# Patient Record
Sex: Male | Born: 1975 | Race: White | Hispanic: No | Marital: Single
Health system: Midwestern US, Community
[De-identification: ages and names within clinical notes are randomized; demographics above are authoritative.]

## PROBLEM LIST (undated history)

## (undated) DIAGNOSIS — K219 Gastro-esophageal reflux disease without esophagitis: Secondary | ICD-10-CM

## (undated) HISTORY — PX: KNEE SURGERY: SHX244

## (undated) HISTORY — PX: SHOULDER SURGERY: SHX246

---

## 2017-04-01 ENCOUNTER — Other Ambulatory Visit: Payer: Self-pay

## 2017-04-01 ENCOUNTER — Encounter (HOSPITAL_BASED_OUTPATIENT_CLINIC_OR_DEPARTMENT_OTHER): Payer: Self-pay

## 2017-04-01 ENCOUNTER — Emergency Department (HOSPITAL_BASED_OUTPATIENT_CLINIC_OR_DEPARTMENT_OTHER)
Admission: EM | Admit: 2017-04-01 | Discharge: 2017-04-01 | Disposition: A | Discharge status: Home / Self Care | Payer: Worker's Compensation | Attending: Emergency Medicine | Admitting: Emergency Medicine

## 2017-04-01 ENCOUNTER — Emergency Department (HOSPITAL_BASED_OUTPATIENT_CLINIC_OR_DEPARTMENT_OTHER): Payer: Worker's Compensation

## 2017-04-01 DIAGNOSIS — S92511A Displaced fracture of proximal phalanx of right lesser toe(s), initial encounter for closed fracture: Secondary | ICD-10-CM | POA: Insufficient documentation

## 2017-04-01 DIAGNOSIS — W230XXA Caught, crushed, jammed, or pinched between moving objects, initial encounter: Secondary | ICD-10-CM | POA: Diagnosis not present

## 2017-04-01 DIAGNOSIS — Y990 Civilian activity done for income or pay: Secondary | ICD-10-CM | POA: Diagnosis not present

## 2017-04-01 DIAGNOSIS — S92536A Nondisplaced fracture of distal phalanx of unspecified lesser toe(s), initial encounter for closed fracture: Secondary | ICD-10-CM

## 2017-04-01 DIAGNOSIS — Y939 Activity, unspecified: Secondary | ICD-10-CM | POA: Diagnosis not present

## 2017-04-01 DIAGNOSIS — Y929 Unspecified place or not applicable: Secondary | ICD-10-CM | POA: Insufficient documentation

## 2017-04-01 DIAGNOSIS — S99921A Unspecified injury of right foot, initial encounter: Secondary | ICD-10-CM | POA: Diagnosis present

## 2017-04-01 MED ORDER — TRAMADOL HCL 50 MG PO TABS: 50.0000 mg | ORAL_TABLET | Freq: Four times a day (QID) | ORAL | 0 refills | Status: AC | PRN

## 2017-04-01 NOTE — Discharge Instructions (Signed)
Your seen here today and found to have distal phalanx fracture of the second and third digits.  I have placed you in buddy tape and postop shoe for protection.  Please wear these when walking until follow-up with your the orthopedist I have referred you to.   For pain control you may take: 800mg  of ibuprofen (that is usually four 200mg  over the counter pills) up to 3 times a day (please take with food) and acetaminophen 975mg  (this is 3 normal strength, 325mg , over the counter pills) up to four times a day. Please do not take more than this. Do not drink alcohol or combine with other medications that have acetaminophen or Ibuprofen as an ingredient (Read the labels!).    For breakthrough pain you may take Ultram. Do not drink alcohol drive or operate heavy machinery when taking. You are being provided a prescription for opiates (also known as narcotics) for pain control on an ?as needed? basis.  Opiates can be addictive and should only be used when absolutely necessary for pain control when other alternatives do not work.  We recommend you only use them for the recommended amount of time and only as prescribed.  Please do not take with other sedative medications or alcohol.  Please do not drive, operate machinery, or make important decisions while taking opiates.  Please note that these medications can be addictive and have high abuse potential.  Please keep these medications locked away from children, teenagers or any family members with history of substance abuse. Additionally, these medications may cause constipation - take over the counter stool softeners or add fiber to your diet to treat this (Metamucil, Psyllium Fiber, Colace, Miralax) Further refills will need to be obtained from your primary care doctor and will not be prescribed through the Emergency Department. You will test positive on most drug tests while taking this medication.

## 2017-04-01 NOTE — ED Notes (Signed)
Pt. Has noted edema in the Second L toe and Third L toe after a crush type injury today at his job.  Pt reports a heavy object ran over his toes.  Pt. Toenail on the Second L toes is still slightly attached and discolored.  The Third L toenail is still attached and WNL upon assessment.  The Second L toes is purple and edematous at the End of the toe.  Pt. Third L toe is edematous and slightly discolored.  Both are slightly bleeding and controlled.

## 2017-04-01 NOTE — ED Triage Notes (Signed)
Pt reports that he crushed his toes with a "maintence stand" that is "a couple hundred pounds." Pt has bandage on 2nd and third digit.

## 2017-04-01 NOTE — ED Provider Notes (Signed)
[facility] EMERGENCY DEPARTMENT Provider Note   CSN: [id number] Arrival date & time: 04/01/17  1725     History   Chief Complaint Chief Complaint  Patient presents with  . Foot Injury    HPI Matthew Fritz is a 42 y.o. male with no signficant past medical history who presents to the ED today for crush like injury to right foot. Patinet states around 420 today he was at work and a large middle structure that weighs greater than 100 pounds rolled over his right foot.  He notes that he now has pain to the second and third digits as worse with ambulation and bending of the toes.  He has taken Tylenol prior to arrival with great relief of his symptoms.  He denies any associated numbness or tingling. Tetanus is up to date.   HPI  History reviewed. No pertinent past medical history.  There are no active problems to display for this patient.   Past Surgical History:  Procedure Laterality Date  . KNEE SURGERY Right   . SHOULDER SURGERY Right        Home Medications    Prior to Admission medications   Not on File    Family History No family history on file.  Social History Social History   Tobacco Use  . Smoking status: Never Smoker  . Smokeless tobacco: Never Used  Substance Use Topics  . Alcohol use: Yes  . Drug use: No     Allergies   Penicillins   Review of Systems Review of Systems  All other systems reviewed and are negative.    Physical Exam Updated Vital Signs BP 129/69 (BP Location: Left Arm)   Pulse 77   Temp 98.1 F (36.7 C) (Oral)   Resp 18   Ht 6' (1.829 m)   Wt 111.1 kg (245 lb)   SpO2 100%   BMI 33.23 kg/m   Physical Exam  Constitutional: He appears well-developed and well-nourished.  HENT:  Head: Normocephalic and atraumatic.  Right Ear: External ear normal.  Left Ear: External ear normal.  Eyes: Conjunctivae are normal. Right eye exhibits no discharge. Left eye exhibits no discharge. No scleral icterus.    Pulmonary/Chest: Effort normal. No respiratory distress.  Musculoskeletal:       Right ankle: Normal.       Right foot: There is tenderness.       Feet:  Patient with TTP of the distal 2nd and 3rd digit of the right foot. No obvious laceration to the 2nd toenail. There is noting to be some lifting of the 2nd digit toenail without obvious subungual hematoma. Distal 2nd toe is ecchymotic and edematous. Toepad soft. Compartments soft. NVI.   Neurological: He is alert. He has normal strength. No sensory deficit.  Skin: Skin is warm and dry. Capillary refill takes less than 2 seconds. No laceration noted. No pallor.  Psychiatric: He has a normal mood and affect.  Nursing note and vitals reviewed.    ED Treatments / Results  Labs (all labs ordered are listed, but only abnormal results are displayed) Labs Reviewed - No data to display  EKG  EKG Interpretation None       Radiology Dg Foot Complete Right  Result Date: 04/01/2017 CLINICAL DATA:  Crush injury to the second and third toes. EXAM: RIGHT FOOT COMPLETE - 3+ VIEW COMPARISON:  None. FINDINGS: There are comminuted, essentially nondisplaced fractures of the second and third distal phalanges. Alignment is normal. Mild hallux valgus deformity with  associated first MTP joint osteoarthritis. Juxta-articular erosion along the lateral aspect of the third metatarsal head. Bone mineralization is normal. Soft tissues are unremarkable. IMPRESSION: 1. Comminuted, essentially nondisplaced fractures of the second and third distal phalanges. 2. Chronic appearing juxta-articular erosion at the lateral aspect of the third metatarsal head, nonspecific, but can be seen with gout. Electronically Signed   By: Obie DredgeWilliam T Derry M.D.   On: 04/01/2017 18:25    Procedures Procedures (including critical care time) SPLINT APPLICATION Date/Time: 7:00 PM Authorized by: Jacinto HalimMichael M Neil Brickell Consent: Verbal consent obtained. Risks and benefits: risks, benefits and  alternatives were discussed Consent given by: patient Splint applied by: orthopedic technician Location details: right foot  Splint type: Post-op shoe Supplies used: post op shoe Post-procedure: The splinted body part was neurovascularly unchanged following the procedure. Patient tolerance: Patient tolerated the procedure well with no immediate complications.   Medications Ordered in ED Medications - No data to display   Initial Impression / Assessment and Plan / ED Course  I have reviewed the triage vital signs and the nursing notes.  Pertinent labs & imaging results that were available during my care of the patient were reviewed by me and considered in my medical decision making (see chart for details).     42 y.o. male with a crush-like injury to right foot.  There is noted to be comminuted, nondisplaced fractures of the second and third distal phalanx.  Patient without obvious open wound. Appears to be a closed fracture. There is some no changes to the second toenail but without obvious laceration that required repair in the department.  His tetanus is up-to-date.  Will place him in a postop shoe with buddy tape.  Pain is currently controlled with over-the-counter medications.  Will give referral for orthopedist to follow-up this week.  Return precautions discussed.  Patient appears safe for discharge.  Patient case discussed with Dr. Anitra LauthPlunkett who is in agreement with plan.   Final Clinical Impressions(s) / ED Diagnoses   Final diagnoses:  Nondisplaced fracture of distal phalanx of toe    ED Discharge Orders    None       Jacinto HalimMaczis, Alee Gressman M, Cordelia Poche-C 04/02/17 Valentina [id number]    Gwyneth SproutPlunkett, Whitney, MD 04/03/17 [phone]

## 2018-09-22 IMAGING — CR DG FOOT COMPLETE 3+V*R*
3 series · 3 of 3 positions shown · non-contrast
Comparison: None.

CLINICAL DATA: Crush injury to the second and third toes.

EXAM:
RIGHT FOOT COMPLETE - 3+ VIEW

[t foot ap right]
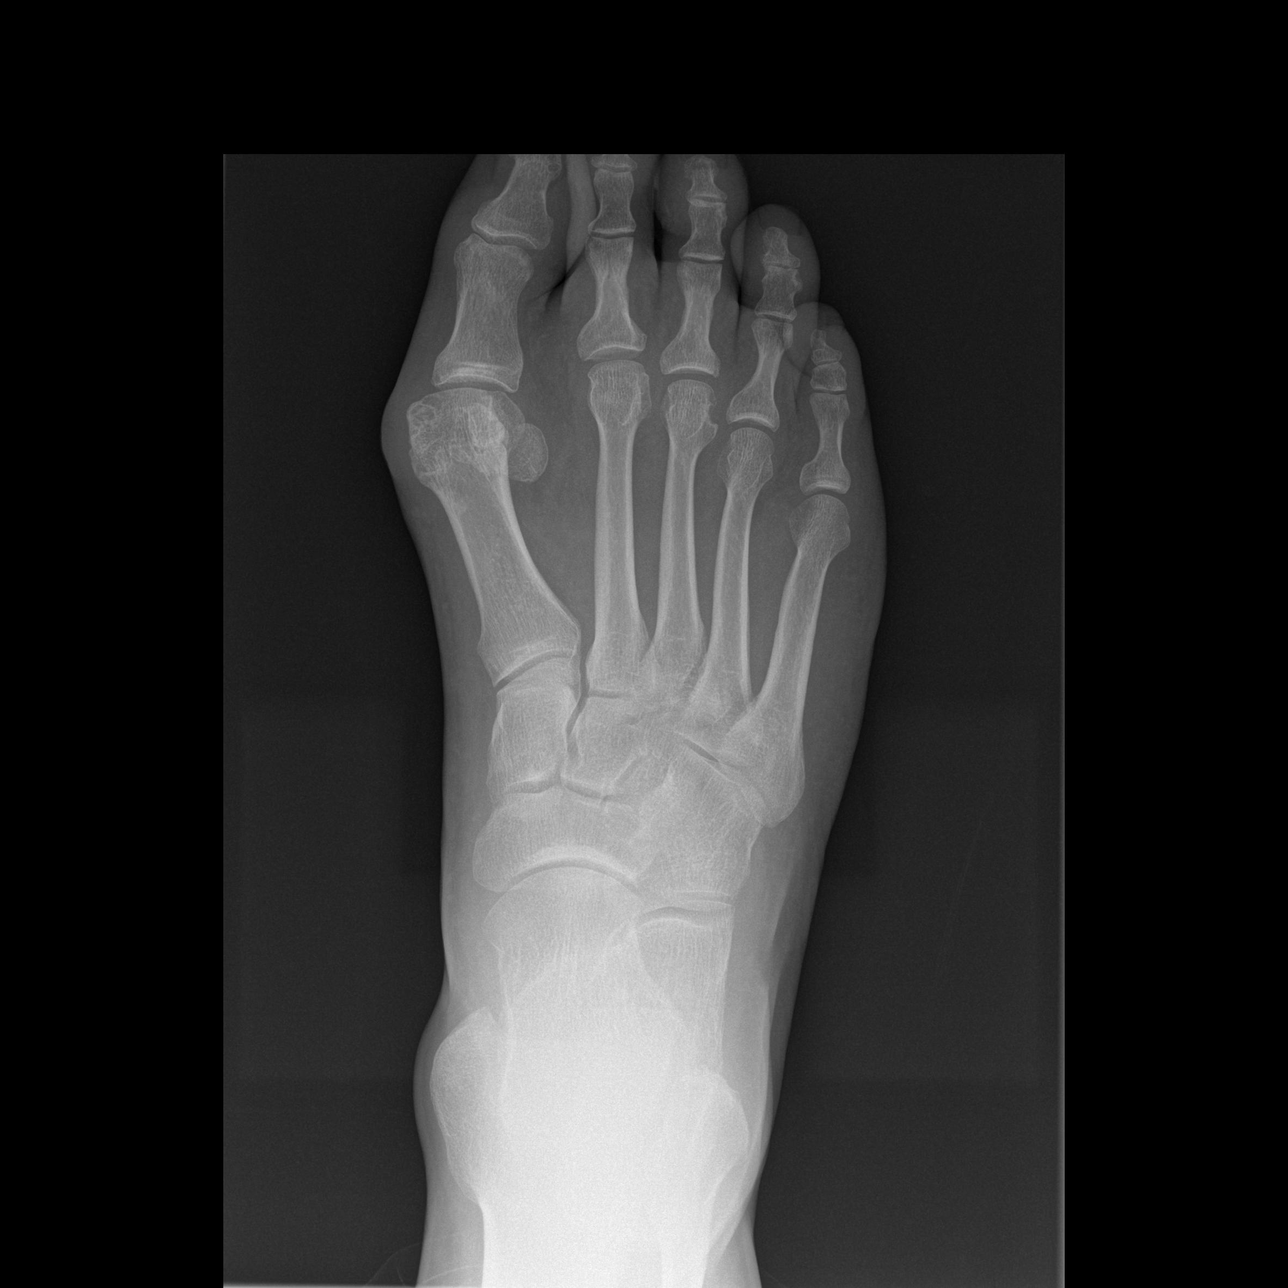

[t foot oblique right]
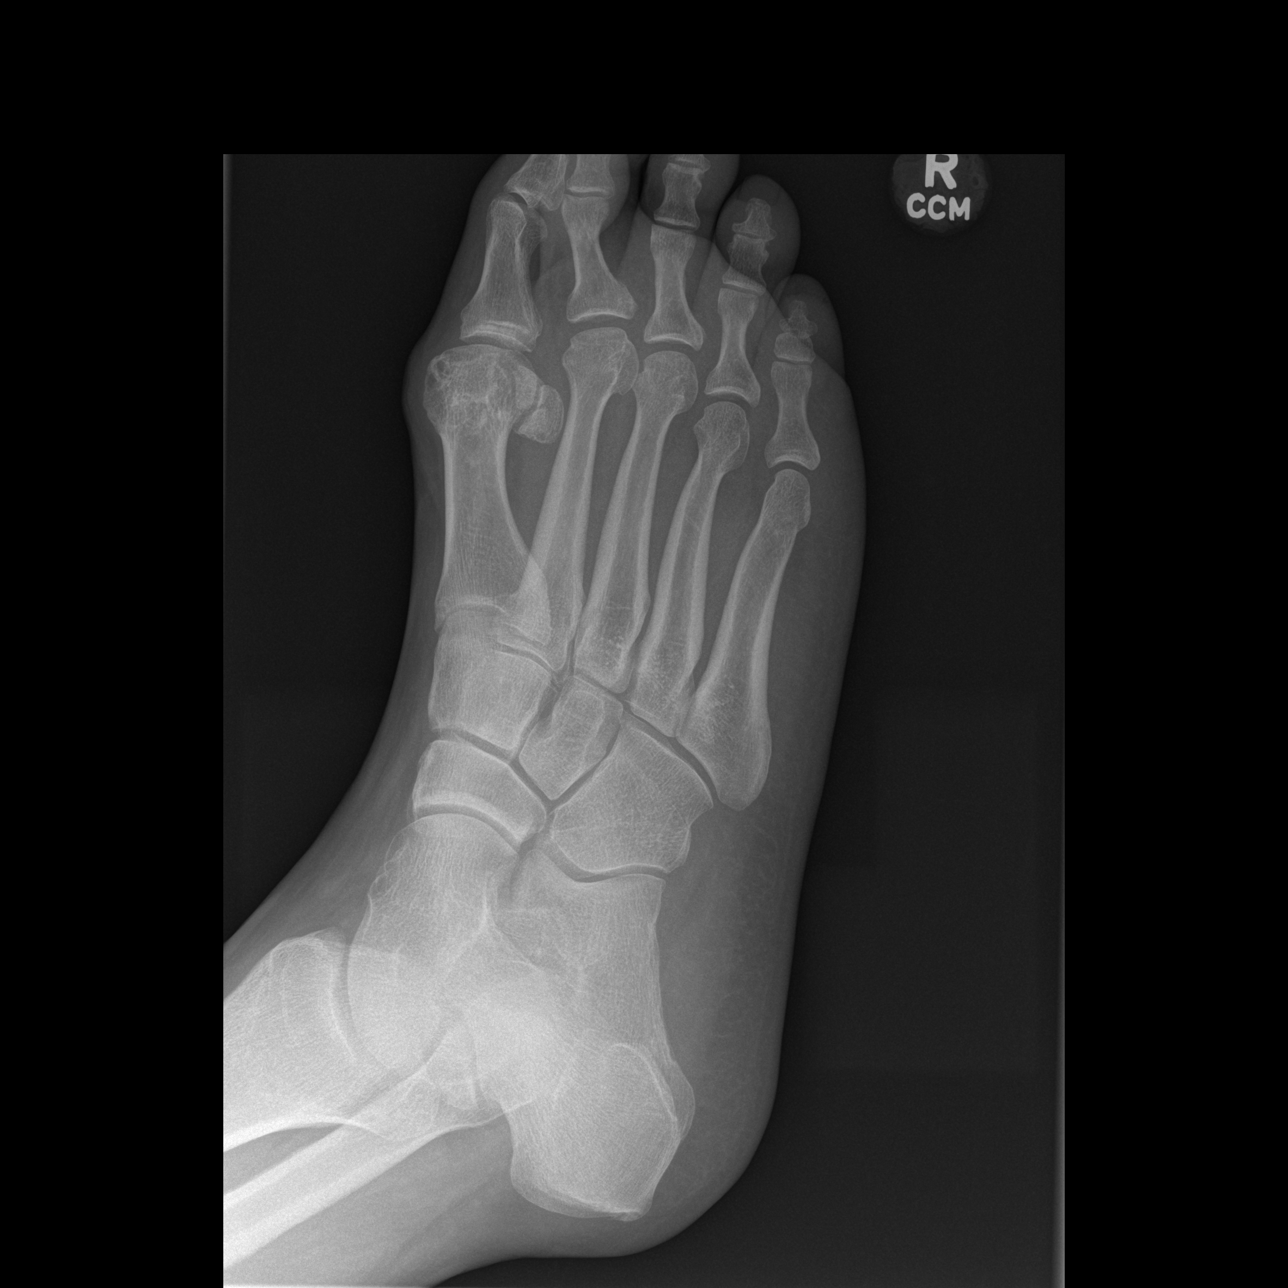

[t foot lat right]
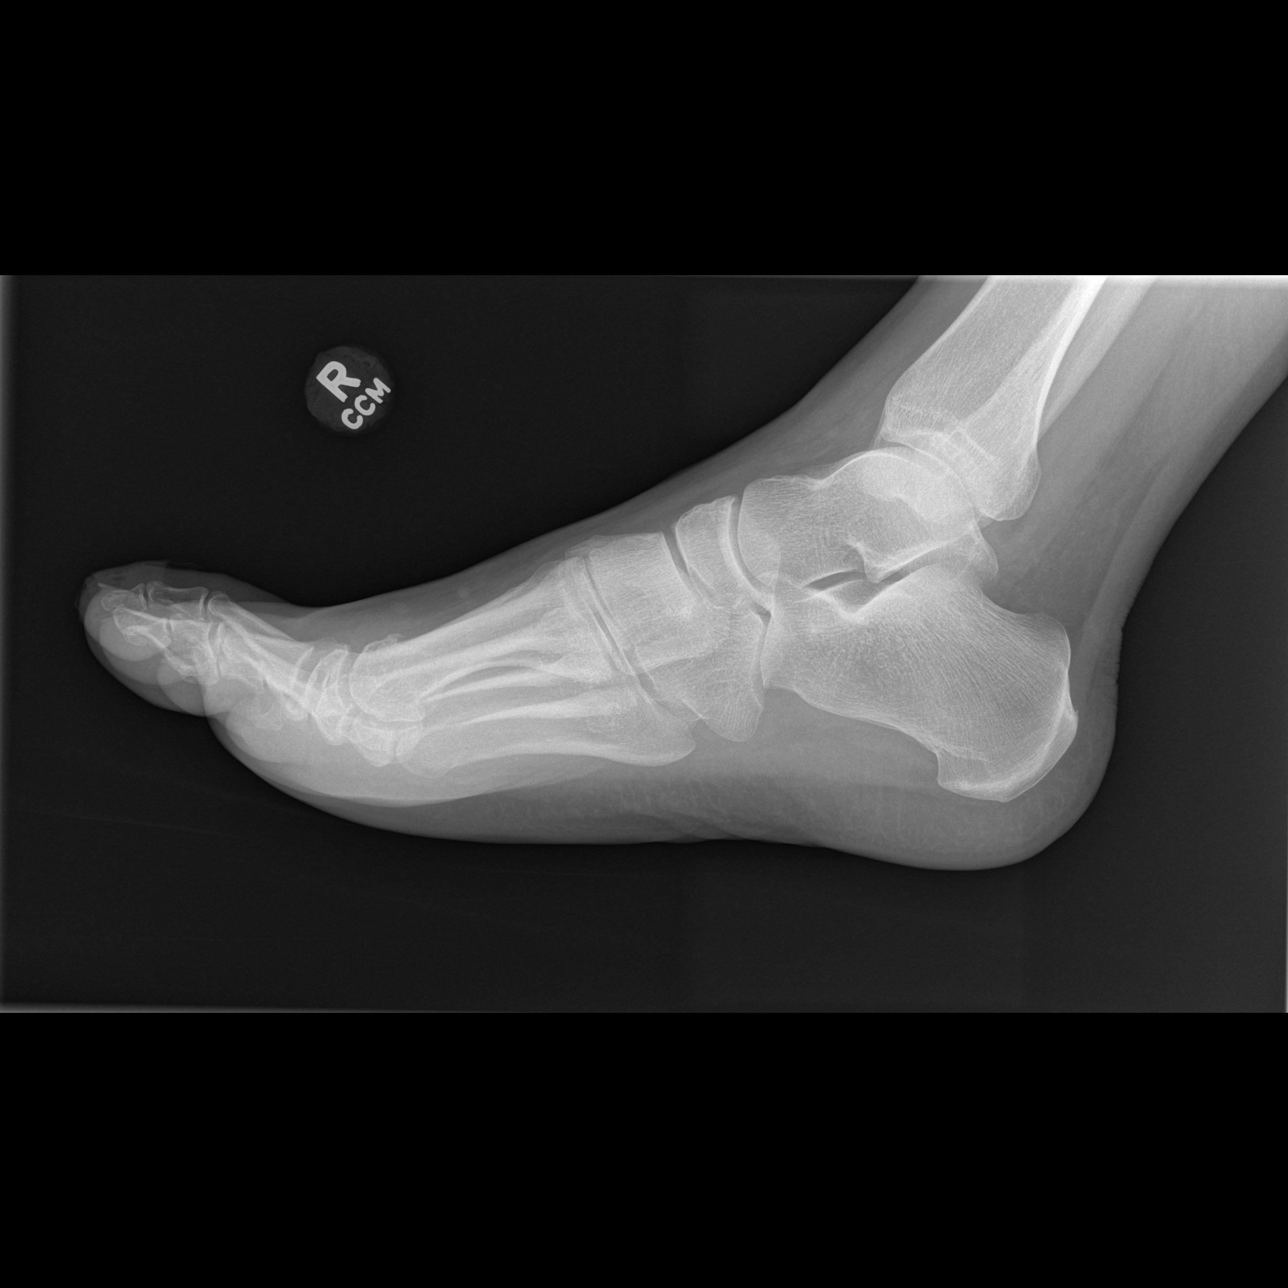

[3 of 3 positions shown; findings below may reference images not displayed]

FINDINGS: There are comminuted, essentially nondisplaced fractures of the
second and third distal phalanges. Alignment is normal. Mild hallux
valgus deformity with associated first MTP joint osteoarthritis.
Juxta-articular erosion along the lateral aspect of the third
metatarsal head. Bone mineralization is normal. Soft tissues are
unremarkable.
IMPRESSION: 1. Comminuted, essentially nondisplaced fractures of the second and
third distal phalanges.
2. Chronic appearing juxta-articular erosion at the lateral aspect
of the third metatarsal head, nonspecific, but can be seen with
gout.

## 2019-08-29 ENCOUNTER — Other Ambulatory Visit: Payer: Self-pay

## 2019-08-29 ENCOUNTER — Encounter (HOSPITAL_BASED_OUTPATIENT_CLINIC_OR_DEPARTMENT_OTHER): Payer: Self-pay | Admitting: *Deleted

## 2019-08-29 ENCOUNTER — Emergency Department (HOSPITAL_BASED_OUTPATIENT_CLINIC_OR_DEPARTMENT_OTHER): Payer: No Typology Code available for payment source

## 2019-08-29 ENCOUNTER — Emergency Department (HOSPITAL_BASED_OUTPATIENT_CLINIC_OR_DEPARTMENT_OTHER)
Admission: EM | Admit: 2019-08-29 | Discharge: 2019-08-29 | Disposition: A | Discharge status: Home / Self Care | Payer: No Typology Code available for payment source | Attending: Emergency Medicine | Admitting: Emergency Medicine

## 2019-08-29 DIAGNOSIS — R0602 Shortness of breath: Secondary | ICD-10-CM | POA: Diagnosis not present

## 2019-08-29 DIAGNOSIS — R0789 Other chest pain: Secondary | ICD-10-CM | POA: Insufficient documentation

## 2019-08-29 DIAGNOSIS — R079 Chest pain, unspecified: Secondary | ICD-10-CM | POA: Diagnosis present

## 2019-08-29 HISTORY — DX: Gastro-esophageal reflux disease without esophagitis: K21.9

## 2019-08-29 LAB — BASIC METABOLIC PANEL
Anion gap: 11 (ref 5–15)
BUN: 13 mg/dL (ref 6–20)
CO2: 25 mmol/L (ref 22–32)
Calcium: 8.9 mg/dL (ref 8.9–10.3)
Chloride: 100 mmol/L (ref 98–111)
Creatinine, Ser: 0.97 mg/dL (ref 0.61–1.24)
GFR calc Af Amer: >60 mL/min (ref >60)
GFR calc non Af Amer: >60 mL/min (ref >60)
Glucose, Bld: 99 mg/dL (ref 70–99)
Potassium: 3.6 mmol/L (ref 3.5–5.1)
Sodium: 136 mmol/L (ref 135–145)

## 2019-08-29 LAB — CBC
HCT: 43.2 % (ref 39.0–52.0)
Hemoglobin: 14.9 g/dL (ref 13.0–17.0)
MCH: 31.8 pg (ref 26.0–34.0)
MCHC: 34.5 g/dL (ref 30.0–36.0)
MCV: 92.1 fL (ref 80.0–100.0)
Platelets: 280 10*3/uL (ref 150–400)
RBC: 4.69 MIL/uL (ref 4.22–5.81)
RDW: 12.2 % (ref 11.5–15.5)
WBC: 6.4 10*3/uL (ref 4.0–10.5)
nRBC: 0 % (ref 0.0–0.2)

## 2019-08-29 LAB — TROPONIN I (HIGH SENSITIVITY)
Troponin I (High Sensitivity): 2 ng/L (ref <18)
Troponin I (High Sensitivity): 2 ng/L (ref <18)

## 2019-08-29 MED ORDER — SODIUM CHLORIDE 0.9% FLUSH
3.0000 mL | Freq: Once | INTRAVENOUS | Status: DC
  Filled 2019-08-29: qty 3

## 2019-08-29 NOTE — ED Triage Notes (Signed)
Chest tightness. Sudden onset of feeling that he could not breathe.

## 2019-08-29 NOTE — ED Provider Notes (Signed)
[facility] EMERGENCY DEPARTMENT Provider Note   CSN: [id number] Arrival date & time: 08/29/19  1448     History Chief Complaint  Patient presents with  . Chest Pain    Matthew Fritz is a 44 y.o. male.  HPI  HPI: A 44 year old patient with a history of obesity presents for evaluation of chest pain. Initial onset of pain was more than 6 hours ago. The patient's chest pain is described as heaviness/pressure/tightness and is not worse with exertion. The patient's chest pain is middle- or left-sided, is not well-localized, is not sharp and does not radiate to the arms/jaw/neck. The patient does not complain of nausea and denies diaphoresis. The patient has no history of stroke, has no history of peripheral artery disease, has not smoked in the past 90 days, denies any history of treated diabetes, has no relevant family history of coronary artery disease (first degree relative at less than age 27), is not hypertensive and has no history of hypercholesterolemia.    Pt is a 44 y/o male who presents to the ED today for eval of chest discomfort. Describes sxs at tightness and like someone is sitting on his chest. States sxs started last night. Pain located to the bilat upper chest.  States he is not really experiencing pain, rates sxs at 5-6/10. Pain is constant and seems to be waxing and waning, but has improved since onset. He reports associated shortness of breath. Denies pleuritic pain. Pain does not radiated. Pain is not specifically associated with exertion. Denies associated nausea, vomiting, diaphoresis  Denies HTN, HLD, DM. Has very distant h/o tobacco use. Denies any early family hx of heart disease. Denies leg pain/swelling, hemoptysis, recent surgery/trauma, recent long travel, personal hx of cancer, or hx of DVT/PE.    Past Medical History:  Diagnosis Date  . GERD (gastroesophageal reflux disease)     There are no problems to display for this patient.   Past Surgical  History:  Procedure Laterality Date  . KNEE SURGERY Right   . SHOULDER SURGERY Right        No family history on file.  Social History   Tobacco Use  . Smoking status: Never Smoker  . Smokeless tobacco: Never Used  Vaping Use  . Vaping Use: Never used  Substance Use Topics  . Alcohol use: Yes  . Drug use: No    Home Medications Prior to Admission medications   Medication Sig Start Date End Date Taking? Authorizing Provider  colestipol (COLESTID) 1 g tablet Take by mouth. 06/26/19  Yes [provider]  dicyclomine (BENTYL) 20 MG tablet Take 20 mg by mouth 3 (three) times daily. 06/26/19  Yes [provider]  omeprazole (PRILOSEC) 20 MG capsule Take 20 mg by mouth daily. 06/22/19  Yes [provider]  traMADol (ULTRAM) 50 MG tablet Take 1 tablet (50 mg total) by mouth every 6 (six) hours as needed. 04/01/17   Maczis, Elmer Sow, PA-C    Allergies    Penicillins  Review of Systems   Review of Systems  Constitutional: Negative for fever.  HENT: Negative for ear pain and sore throat.   Eyes: Negative for pain and visual disturbance.  Respiratory: Positive for shortness of breath. Negative for cough.   Cardiovascular: Positive for chest pain.  Gastrointestinal: Negative for abdominal pain, constipation, diarrhea, nausea and vomiting.  Genitourinary: Negative for dysuria and hematuria.  Musculoskeletal: Negative for back pain.  Skin: Negative for color change and rash.  Neurological: Negative for seizures  and syncope.  All other systems reviewed and are negative.   Physical Exam Updated Vital Signs BP (!) 135/79   Pulse 72   Temp 98.4 F (36.9 C) (Oral)   Resp 18   Ht 6' (1.829 m)   Wt (!) 113.4 kg   SpO2 96%   BMI 33.91 kg/m   Physical Exam Vitals and nursing note reviewed.  Constitutional:      Appearance: He is well-developed.  HENT:     Head: Normocephalic and atraumatic.  Eyes:     Conjunctiva/sclera: Conjunctivae normal.   Cardiovascular:     Rate and Rhythm: Normal rate and regular rhythm.     Heart sounds: No murmur heard.   Pulmonary:     Effort: Pulmonary effort is normal. No respiratory distress.     Breath sounds: Normal breath sounds. No decreased breath sounds, wheezing, rhonchi or rales.  Abdominal:     Palpations: Abdomen is soft.     Tenderness: There is no abdominal tenderness.  Musculoskeletal:     Cervical back: Neck supple.     Right lower leg: No tenderness. No edema.     Left lower leg: No tenderness. No edema.  Skin:    General: Skin is warm and dry.  Neurological:     Mental Status: He is alert.     ED Results / Procedures / Treatments   Labs (all labs ordered are listed, but only abnormal results are displayed) Labs Reviewed  BASIC METABOLIC PANEL  CBC  TROPONIN I (HIGH SENSITIVITY)  TROPONIN I (HIGH SENSITIVITY)    EKG EKG Interpretation  Date/Time:  Tuesday August 29 2019 15:01:01 EDT Ventricular Rate:  61 PR Interval:  164 QRS Duration: 92 QT Interval:  408 QTC Calculation: 410 R Axis:   56 Text Interpretation: Normal sinus rhythm Normal ECG No STEMI Confirmed by Alvester Chou [phone]) on 08/29/2019 3:14:00 PM   Radiology DG Chest 2 View  Result Date: 08/29/2019 CLINICAL DATA:  Chest pain EXAM: CHEST - 2 VIEW COMPARISON:  None. FINDINGS: The cardiomediastinal silhouette is normal in contour. No pleural effusion. No pneumothorax. No acute pleuroparenchymal abnormality. Visualized abdomen is unremarkable. Postsurgical changes of the RIGHT acromioclavicular joint. IMPRESSION: No acute cardiopulmonary abnormality. Electronically Signed   By: Meda Klinefelter MD   On: 08/29/2019 15:22    Procedures Procedures (including critical care time)  Medications Ordered in ED Medications - No data to display  ED Course  I have reviewed the triage vital signs and the nursing notes.  Pertinent labs & imaging results that were available during my care of the patient  were reviewed by me and considered in my medical decision making (see chart for details).    MDM Rules/Calculators/A&P HEAR Score: 65                        44 year old male presenting for evaluation of chest pain starting yesterday.  Reviewed/interpreted labs CBC without leukocytosis or anemia BMP normal  Initial trop neg  EKG is nonischemic without arrhythmia or other abnormality.  Chest x-ray personally reviewed/interpreted and not show any evidence of cardiopulmonary abnormality.  At shift change, care transitioned to Army Melia, PA-C with plan to f/u on delta trop. If negative, feel pt appropriate for d/c as his symptoms sound atypical in nature.  He has a heart score of 3 and therefore is low risk for ACS.  I have low suspicion for PE, pt PERC negative. Doubt dissection, esophageal rupture or other  emergent cause of sxs.   Final Clinical Impression(s) / ED Diagnoses Final diagnoses:  Atypical chest pain    Rx / DC Orders ED Discharge Orders    None       Karrie Meres, PA-C 08/30/19 1410    Geoffery Lyons, MD 08/30/19 1542

## 2020-01-22 ENCOUNTER — Inpatient Hospital Stay
Admit: 2020-01-22 | Discharge: 2020-01-22 | Disposition: A | Discharge status: Home / Self Care | Payer: PRIVATE HEALTH INSURANCE | Attending: Emergency Medicine

## 2020-01-22 DIAGNOSIS — R040 Epistaxis: Secondary | ICD-10-CM

## 2020-01-22 MED ORDER — LIDOCAINE 2 % MUCOSAL SOLN
2 % | Status: AC
Start: 2020-01-22 — End: 2020-01-22
  Administered 2020-01-22: 13:00:00

## 2020-01-22 MED ORDER — OXYMETAZOLINE 0.05 % NASAL SPRAY AEROSOL
0.05 % | NASAL | Status: AC
Start: 2020-01-22 — End: 2020-01-22
  Administered 2020-01-22: 13:00:00 via NASAL

## 2020-01-22 MED FILL — LIDOCAINE VISCOUS 2 % MUCOSAL SOLUTION: 2 % | Qty: 15

## 2020-01-22 MED FILL — NASAL SPRAY (OXYMETAZOLINE) 0.05 %: 0.05 % | NASAL | Qty: 2

## 2020-01-22 NOTE — ED Provider Notes (Signed)
44 yo M presenting to ED with cocnern of acute R side epistaxis after blowing his nose this am. Had 2 episodes epistaxis from R nares that were 2 days ago. He had trouble stopping his nose bleeding today. No dizziness, fatigue. No recent fever or sinus infection.           No past medical history on file.    No past surgical history on file.      No family history on file.    Social History     Socioeconomic History   ??? Marital status: SINGLE     Spouse name: Not on file   ??? Number of children: Not on file   ??? Years of education: Not on file   ??? Highest education level: Not on file   Occupational History   ??? Not on file   Tobacco Use   ??? Smoking status: Not on file   ??? Smokeless tobacco: Not on file   Substance and Sexual Activity   ??? Alcohol use: Not on file   ??? Drug use: Not on file   ??? Sexual activity: Not on file   Other Topics Concern   ??? Not on file   Social History Narrative   ??? Not on file     Social Determinants of Health     Financial Resource Strain:    ??? Difficulty of Paying Living Expenses: Not on file   Food Insecurity:    ??? Worried About Running Out of Food in the Last Year: Not on file   ??? Ran Out of Food in the Last Year: Not on file   Transportation Needs:    ??? Lack of Transportation (Medical): Not on file   ??? Lack of Transportation (Non-Medical): Not on file   Physical Activity:    ??? Days of Exercise per Week: Not on file   ??? Minutes of Exercise per Session: Not on file   Stress:    ??? Feeling of Stress : Not on file   Social Connections:    ??? Frequency of Communication with Friends and Family: Not on file   ??? Frequency of Social Gatherings with Friends and Family: Not on file   ??? Attends Religious Services: Not on file   ??? Active Member of Clubs or Organizations: Not on file   ??? Attends Banker Meetings: Not on file   ??? Marital Status: Not on file   Intimate Partner Violence:    ??? Fear of Current or Ex-Partner: Not on file   ??? Emotionally Abused: Not on file   ??? Physically Abused: Not  on file   ??? Sexually Abused: Not on file   Housing Stability:    ??? Unable to Pay for Housing in the Last Year: Not on file   ??? Number of Places Lived in the Last Year: Not on file   ??? Unstable Housing in the Last Year: Not on file         ALLERGIES: Patient has no known allergies.    Review of Systems   HENT: Positive for nosebleeds.    All other systems reviewed and are negative.      Vitals:    01/22/20 0819   BP: 132/81   Pulse: 62   Resp: 18   Temp: 98.8 ??F (37.1 ??C)   SpO2: 98%   Weight: 83.9 kg (185 lb)   Height: 5\' 10"  (1.778 m)  Physical Exam  Vitals and nursing note reviewed.   Constitutional:       Appearance: Normal appearance. He is normal weight.   HENT:      Head: Normocephalic and atraumatic.      Nose: Nose normal. No rhinorrhea.      Comments: R anterior nares medial epistaxis     Mouth/Throat:      Mouth: Mucous membranes are moist.      Pharynx: Oropharynx is clear.   Eyes:      Extraocular Movements: Extraocular movements intact.      Pupils: Pupils are equal, round, and reactive to light.   Pulmonary:      Effort: Pulmonary effort is normal.   Musculoskeletal:      Cervical back: Normal range of motion and neck supple.   Skin:     General: Skin is warm and dry.   Neurological:      General: No focal deficit present.      Mental Status: He is alert and oriented to person, place, and time. Mental status is at baseline.   Psychiatric:         Mood and Affect: Mood normal.         Behavior: Behavior normal.          MDM  Number of Diagnoses or Management Options  Anterior epistaxis  Diagnosis management comments: 44 yo M presenting to ED with cocnern of acute R side epistaxis after blowing his nose this am. Had 2 episodes epistaxis from R nares that were 2 days ago. He had trouble stopping his nose bleeding today. No dizziness, fatigue. No recent fever or sinus infection.    Vitals stable.  Patient well-appearing on assessment.  He has right anterior naris epistaxis.  Viscous lidocaine was  next with oxymetazoline and placed on a cotton pledget.  This was placed into the right naris.  The patient had excellent hemostasis afterward.  After removal of the cotton, there is evidence for a focal area of epistaxis from the right anteromedial naris.  Silver nitrate was used to cauterize this area with significant success.  The patient did not have any residual bleeding after 30 minutes and ambulation.  He is discharged in good condition, instructed on not blowing the nose, itching, or using intranasal medications for the next several days.  He will otherwise use Sudafed or a similar D congestion over the next 1 week.  He will follow-up closely with his PCP.    (R04.0) Anterior epistaxis  (primary encounter diagnosis)         Procedures      NIH Stroke Scale

## 2020-01-22 NOTE — ED Notes (Signed)
Discharge instructions reviewed with the pt by this RN.  Pt signed paperwork and ambulated out of the ED with a steady gait and in no apparent distress.

## 2020-01-22 NOTE — ED Notes (Signed)
Pt ambulates from the waiting room for a nose bleed that has been intermittent since 3 days ago. Pt states that around 6 am this morning it started again and all efforts have failed to get it to stop. Pt had direct pressure on his nose in triage and bleeding is controlled, but returns if pressure is released. Pt denies having any significant history of nose bleeds in the past

## 2020-01-23 DIAGNOSIS — R040 Epistaxis: Secondary | ICD-10-CM

## 2020-01-23 NOTE — ED Notes (Signed)
Patient discharged from urgent care in no acute distress. All discharge instructions reviewed with patient with verbalization of understanding. Printed discharge instructions provided to patient. Patient denies further questions or concerns at this time.

## 2020-01-23 NOTE — ED Provider Notes (Signed)
[facility] Provider Note by Baker Pierini, PA at 01/23/20 2020                Author: Baker Pierini, PA  Service: Emergency Medicine  Author Type: Physician Assistant       Filed: 01/23/20 2103  Date of Service: 01/23/20 2020  Status: Attested           Editor: Baker Pierini, PA (Physician Assistant)  Cosigner: Debarah Crape, MD at 01/23/20 2107          Attestation signed by Debarah Crape, MD at 01/23/20 2107          I have reviewed the PA chart documentation for this patient.                                 44 year old male presents for evaluation of recurrent nose bleed from the right near.  Patient states that he has  no prior history of nose bleeds but over the last week he had recurrent bleeding and was seen over the weekend in the emergency room at [facility] and had the nose cauterized.  This evening he sneezed and symptoms started again.  He has been applying  local pressure with persistent bleeding and it has not subsided.  He does presented to the Urgent Care for re-evaluation.                No past medical history on file.       No past surgical history on file.        No family history on file.         Social History          Socioeconomic History         ?  Marital status:  SINGLE              Spouse name:  Not on file         ?  Number of children:  Not on file     ?  Years of education:  Not on file     ?  Highest education level:  Not on file       Occupational History        ?  Not on file       Tobacco Use         ?  Smoking status:  Not on file     ?  Smokeless tobacco:  Not on file       Substance and Sexual Activity         ?  Alcohol use:  Not on file     ?  Drug use:  Not on file     ?  Sexual activity:  Not on file        Other Topics  Concern        ?  Not on file       Social History Narrative        ?  Not on file          Social Determinants of Health          Financial Resource Strain:         ?  Difficulty of Paying Living Expenses: Not on file       Food Insecurity:         ?   Worried About Running Out  of Food in the Last Year: Not on file     ?  Ran Out of Food in the Last Year: Not on file       Transportation Needs:         ?  Lack of Transportation (Medical): Not on file     ?  Lack of Transportation (Non-Medical): Not on file       Physical Activity:         ?  Days of Exercise per Week: Not on file        ?  Minutes of Exercise per Session: Not on file       Stress:         ?  Feeling of Stress : Not on file       Social Connections:         ?  Frequency of Communication with Friends and Family: Not on file     ?  Frequency of Social Gatherings with Friends and Family: Not on file     ?  Attends Religious Services: Not on file     ?  Active Member of Clubs or Organizations: Not on file     ?  Attends Banker Meetings: Not on file     ?  Marital Status: Not on file       Intimate Partner Violence:         ?  Fear of Current or Ex-Partner: Not on file     ?  Emotionally Abused: Not on file     ?  Physically Abused: Not on file     ?  Sexually Abused: Not on file       Housing Stability:         ?  Unable to Pay for Housing in the Last Year: Not on file     ?  Number of Places Lived in the Last Year: Not on file        ?  Unstable Housing in the Last Year: Not on file                        ALLERGIES: Patient has no known allergies.      Review of Systems    HENT: Positive for nosebleeds.     All other systems reviewed and are negative.         There were no vitals filed for this visit.      Physical Exam   Vitals reviewed.   HENT:       Head: Normocephalic.      Nose: No nasal deformity, signs of injury or nasal tenderness.      Right Nostril: Epistaxis present.  No foreign body.      Mouth/Throat:      Mouth: Mucous membranes are moist.    Cardiovascular:       Rate and Rhythm: Normal rate.   Pulmonary :       Effort: Pulmonary effort is normal.    Neurological:       Mental Status: He is alert.   Psychiatric :         Mood and Affect: Mood normal.             MDM    Number of Diagnoses or Management Options   Diagnosis management comments: Recurrent epistaxis right nostril- AP lidocaine mixture utilized.  Quick clot gauze was then utilized to pack anterior bleed in the  right septum.  Patient was monitored without recurrent bleeding.  Plan will be follow-up  in 3 days with ENT.  Discussed that if he is unable to be seen in ENT then he could return here and we can remove the packing or re-evaluated.                           ICD-10-CM  ICD-9-CM             1.  Epistaxis   R04.0  784.7                Procedures

## 2020-01-23 NOTE — ED Notes (Signed)
Patient c/o right sided nose bleed that began at approx 1930 today  States he had that area cauterized yesterday am after several nosebleeds the last 3 days.  Denies blood thinner use

## 2020-01-24 ENCOUNTER — Inpatient Hospital Stay
Admit: 2020-01-24 | Discharge: 2020-01-24 | Disposition: A | Discharge status: Home / Self Care | Payer: PRIVATE HEALTH INSURANCE | Attending: Physician Assistant

## 2020-01-24 MED ORDER — LIDOCAINE 4 % TOPICAL SOLN
4 % (0 mg/mL) | Status: DC | PRN
Start: 2020-01-24 — End: 2020-01-24
  Administered 2020-01-24: 02:00:00 via TOPICAL

## 2020-01-24 MED ORDER — EPINEPHRINE 0.1 % NASAL SOLN
1 mg/mL | NASAL | Status: AC
Start: 2020-01-24 — End: 2020-01-23
  Administered 2020-01-24: 02:00:00 via NASAL

## 2020-01-24 MED FILL — LIDOCAINE 4 % TOPICAL SOLN: 4 % (0 mg/mL) | Qty: 50

## 2020-01-24 MED FILL — ADRENALIN 1 MG/ML NASAL SOLUTION: 1 mg/mL | NASAL | Qty: 30

## 2020-01-26 ENCOUNTER — Inpatient Hospital Stay
Admit: 2020-01-26 | Discharge: 2020-01-26 | Disposition: A | Discharge status: Home / Self Care | Payer: PRIVATE HEALTH INSURANCE | Attending: Family Medicine

## 2020-01-26 DIAGNOSIS — Z029 Encounter for administrative examinations, unspecified: Secondary | ICD-10-CM

## 2020-01-26 NOTE — ED Notes (Signed)
Call to ENT Dr Guilford Shi speaking to Dr Sandi Mariscal

## 2020-01-26 NOTE — ED Notes (Signed)
D/C home with written and verbal instructions. Verbalizes understanding.

## 2020-01-26 NOTE — ED Provider Notes (Signed)
HPI   Very pleasant 44 year old gentleman seen in our clinic 3 days ago for a recurrence of right epistaxis.  He was seen in the emergency department 2 days prior and had the bleeding area cauterized.  He states that he sneezed 3 days ago and restarted.  They were not able to get it controlled in our urgent care clinic so placed a rhino rocket.  He is advised to follow-up with ENT but states they are closed today.  States he is tolerating the rhino rocket well and denies any complaints at this time.  Past Medical History:   Diagnosis Date   ??? Hypercholesteremia         No past surgical history on file.      History reviewed. No pertinent family history.     Social History     Socioeconomic History   ??? Marital status: SINGLE     Spouse name: Not on file   ??? Number of children: Not on file   ??? Years of education: Not on file   ??? Highest education level: Not on file   Occupational History   ??? Not on file   Tobacco Use   ??? Smoking status: Never Smoker   ??? Smokeless tobacco: Never Used   Substance and Sexual Activity   ??? Alcohol use: Not Currently   ??? Drug use: Never   ??? Sexual activity: Not on file   Other Topics Concern   ??? Not on file   Social History Narrative   ??? Not on file     Social Determinants of Health     Financial Resource Strain:    ??? Difficulty of Paying Living Expenses: Not on file   Food Insecurity:    ??? Worried About Running Out of Food in the Last Year: Not on file   ??? Ran Out of Food in the Last Year: Not on file   Transportation Needs:    ??? Lack of Transportation (Medical): Not on file   ??? Lack of Transportation (Non-Medical): Not on file   Physical Activity:    ??? Days of Exercise per Week: Not on file   ??? Minutes of Exercise per Session: Not on file   Stress:    ??? Feeling of Stress : Not on file   Social Connections:    ??? Frequency of Communication with Friends and Family: Not on file   ??? Frequency of Social Gatherings with Friends and Family: Not on file   ??? Attends Religious Services: Not on file    ??? Active Member of Clubs or Organizations: Not on file   ??? Attends Banker Meetings: Not on file   ??? Marital Status: Not on file   Intimate Partner Violence:    ??? Fear of Current or Ex-Partner: Not on file   ??? Emotionally Abused: Not on file   ??? Physically Abused: Not on file   ??? Sexually Abused: Not on file   Housing Stability:    ??? Unable to Pay for Housing in the Last Year: Not on file   ??? Number of Places Lived in the Last Year: Not on file   ??? Unstable Housing in the Last Year: Not on file                ALLERGIES: Patient has no known allergies.    Review of Systems    Vitals:    01/26/20 0850   BP: 131/74   Pulse: 63   Resp:  16   Temp: 97 ??F (36.1 ??C)       Physical Exam    MDM  Assessment:  Administrative visit.  Patient was checked into the front desk but after discussion with the patient I was able to contact ear nose and throat to obtain advice from them.  I spoke with Dr. Guilford Shi.  After discussion I am hesitant to remove the rhino rocket now given the fact that he have recurrence of bleeding 3 days ago.  We discussed having him keep the rhino rocket in until Monday at 2:00 p.m. when I was able to make an appointment with Dr. Guilford Shi.  I offered that we could take the packing out today.  Through shared decision making he would like to keep it in so as not to risk of rebleeding over the weekend.  I think this is a reasonable plan.  Provided him with the appointment information with ear nose and throat on Monday at 2:00 p.m..  Did not conduct any physical examination during this visit.         Procedures    Encounter Diagnoses     ICD-10-CM ICD-9-CM   1. Administrative encounter  Z02.9 V68.9
